# Patient Record
Sex: Female | Born: 1970 | Race: Black or African American | Hispanic: No | Marital: Married | Smoking: Current every day smoker
Health system: Southern US, Community
[De-identification: ages and names within clinical notes are randomized; demographics above are authoritative.]

---

## 2005-12-21 ENCOUNTER — Emergency Department (HOSPITAL_COMMUNITY): Admission: EM | Admit: 2005-12-21 | Discharge: 2005-12-22 | Payer: Self-pay | Admitting: Emergency Medicine

## 2008-08-30 ENCOUNTER — Emergency Department (HOSPITAL_COMMUNITY): Admission: EM | Admit: 2008-08-30 | Discharge: 2008-08-30 | Payer: Self-pay | Admitting: Emergency Medicine

## 2010-01-02 ENCOUNTER — Emergency Department (HOSPITAL_COMMUNITY): Admission: EM | Admit: 2010-01-02 | Discharge: 2010-01-02 | Payer: Self-pay | Admitting: Family Medicine

## 2010-01-03 ENCOUNTER — Emergency Department (HOSPITAL_COMMUNITY): Admission: EM | Admit: 2010-01-03 | Discharge: 2010-01-03 | Payer: Self-pay | Admitting: Family Medicine

## 2012-12-27 ENCOUNTER — Encounter (HOSPITAL_COMMUNITY): Payer: Self-pay | Admitting: Emergency Medicine

## 2012-12-27 ENCOUNTER — Emergency Department (HOSPITAL_COMMUNITY)
Admission: EM | Admit: 2012-12-27 | Discharge: 2012-12-27 | Disposition: A | Discharge status: Home / Self Care | Payer: Self-pay | Attending: Emergency Medicine | Admitting: Emergency Medicine

## 2012-12-27 DIAGNOSIS — J3489 Other specified disorders of nose and nasal sinuses: Secondary | ICD-10-CM | POA: Insufficient documentation

## 2012-12-27 DIAGNOSIS — R599 Enlarged lymph nodes, unspecified: Secondary | ICD-10-CM | POA: Insufficient documentation

## 2012-12-27 DIAGNOSIS — J351 Hypertrophy of tonsils: Secondary | ICD-10-CM | POA: Insufficient documentation

## 2012-12-27 DIAGNOSIS — M25642 Stiffness of left hand, not elsewhere classified: Secondary | ICD-10-CM

## 2012-12-27 DIAGNOSIS — F17200 Nicotine dependence, unspecified, uncomplicated: Secondary | ICD-10-CM | POA: Insufficient documentation

## 2012-12-27 DIAGNOSIS — M25649 Stiffness of unspecified hand, not elsewhere classified: Secondary | ICD-10-CM | POA: Insufficient documentation

## 2012-12-27 DIAGNOSIS — J029 Acute pharyngitis, unspecified: Secondary | ICD-10-CM | POA: Insufficient documentation

## 2012-12-27 DIAGNOSIS — R591 Generalized enlarged lymph nodes: Secondary | ICD-10-CM

## 2012-12-27 NOTE — Progress Notes (Signed)
[facility] provided pt with a [employer] application. Set patient up with an apt for enrollment with the [facility] Card at [facility] on 10/3.

## 2012-12-27 NOTE — ED Notes (Signed)
Pt escorted to discharge window. Pt verbalized understanding discharge instructions. In no acute distress.  

## 2012-12-27 NOTE — ED Notes (Addendum)
Patient reports to the emergency department for swollen lymph node on left side and swelling with decreased mobility to second joint in left index finger. Denies difficulty swallowing/eating, denies SOB.

## 2012-12-27 NOTE — ED Provider Notes (Signed)
CSN: [id number]     Arrival date & time 12/27/12  2841 History   First MD Initiated Contact with Patient 12/27/12 [phone]     Chief Complaint  Patient presents with  . Lymphadenopathy  . Finger Injury   (Consider location/radiation/quality/duration/timing/severity/associated sxs/prior Treatment) HPI  Sherri Stokes is a 42 y.o. female complaining of painful swollen right anterior cervical lymph node associated with rhinorrhea over the course last 2 days. Patient also has a atraumatic left second digit PIP stiffness intermittently associated with a mild swelling over 2 days as well. Patient denies any other joint swelling, fever, pain, cough, pharyngitis, shortness of breath, night sweats, unintended weight loss, easy bruising or bleeding.    No past medical history on file. No past surgical history on file. No family history on file. History  Substance Use Topics  . Smoking status: Current Every Day Smoker -- 0.10 packs/day  . Smokeless tobacco: Not on file  . Alcohol Use: Yes     Comment: socially, weekends   OB History   Grav Para Term Preterm Abortions TAB SAB Ect Mult Living                 Review of Systems 10 systems reviewed and found to be negative, except as noted in the HPI   Allergies  Review of patient's allergies indicates no known allergies.  Home Medications  No current outpatient prescriptions on file. BP 121/70  Pulse 94  Temp(Src) 98.8 F (37.1 C) (Oral)  Resp 16  SpO2 100% Physical Exam  Nursing note and vitals reviewed. Constitutional: She is oriented to person, place, and time. She appears well-developed and well-nourished. No distress.  HENT:  Head: Normocephalic.  Mouth/Throat: Oropharynx is clear and moist.  Posterior pharynx is injected, tonsillar hypertrophy exudate or erythema  Eyes: Conjunctivae and EOM are normal. Pupils are equal, round, and reactive to light.  Neck: Normal range of motion.  1 cm tender mobile anterior cervical lymph node  to the right side. Left side shows scattered shoddy lymphadenopathy nontender.  Cardiovascular: Normal rate, regular rhythm and intact distal pulses.   Pulmonary/Chest: Effort normal and breath sounds normal. No stridor. No respiratory distress. She has no wheezes. She has no rales. She exhibits no tenderness.  Abdominal: Soft. There is no tenderness.  Musculoskeletal: Normal range of motion.  Left second digit PIP shows excellent range of motion, no tenderness to palpation, neurovascularly intact.  Lymphadenopathy:    She has cervical adenopathy.  Neurological: She is alert and oriented to person, place, and time.  Psychiatric: She has a normal mood and affect.    ED Course  Procedures (including critical care time) Labs Review Labs Reviewed - No data to display Imaging Review No results found.  MDM   1. Lymphadenopathy   2. Finger stiffness, left      Filed Vitals:   12/27/12 0942  BP: 121/70  Pulse: 94  Temp: 98.8 F (37.1 C)  TempSrc: Oral  Resp: 16  SpO2: 100%     Sherri Stokes is a 42 y.o. female with isolated mobile tender right-sided anterior cervical lymphadenopathy. Thank this is likely secondary to allergic rhinorrhea with postnasal drip. No systemic symptoms. Patient also reports a left-sided finger stiffness, denies trauma, full range of motion today.  Pt is hemodynamically stable, appropriate for, and amenable to discharge at this time. Pt verbalized understanding and agrees with care plan. All questions answered. Outpatient follow-up and specific return precautions discussed.    Note: Portions of this report  may have been transcribed using voice recognition software. Every effort was made to ensure accuracy; however, inadvertent computerized transcription errors may be present      Wynetta Emery, PA-C 12/27/12 1009

## 2012-12-28 NOTE — ED Provider Notes (Signed)
Medical screening examination/treatment/procedure(s) were performed by non-physician practitioner and as supervising physician I was immediately available for consultation/collaboration.  Chrystian Cupples T Chrisha Vogel, MD 12/28/12 0709 

## 2013-01-07 ENCOUNTER — Ambulatory Visit: Payer: Self-pay

## 2013-04-26 ENCOUNTER — Emergency Department (HOSPITAL_COMMUNITY)
Admission: EM | Admit: 2013-04-26 | Discharge: 2013-04-26 | Disposition: A | Discharge status: Home / Self Care | Payer: Self-pay | Attending: Emergency Medicine | Admitting: Emergency Medicine

## 2013-04-26 ENCOUNTER — Encounter (HOSPITAL_COMMUNITY): Payer: Self-pay | Admitting: Emergency Medicine

## 2013-04-26 DIAGNOSIS — R42 Dizziness and giddiness: Secondary | ICD-10-CM | POA: Insufficient documentation

## 2013-04-26 DIAGNOSIS — R51 Headache: Secondary | ICD-10-CM | POA: Insufficient documentation

## 2013-04-26 DIAGNOSIS — F17200 Nicotine dependence, unspecified, uncomplicated: Secondary | ICD-10-CM | POA: Insufficient documentation

## 2013-04-26 DIAGNOSIS — D649 Anemia, unspecified: Secondary | ICD-10-CM | POA: Insufficient documentation

## 2013-04-26 LAB — CBC WITH DIFFERENTIAL/PLATELET
BASOS PCT: 2 % — AB (ref 0–1)
Basophils Absolute: 0.1 10*3/uL (ref 0.0–0.1)
EOS ABS: 0.4 10*3/uL (ref 0.0–0.7)
EOS PCT: 6 % — AB (ref 0–5)
HEMATOCRIT: 26.9 % — AB (ref 36.0–46.0)
HEMOGLOBIN: 7.4 g/dL — AB (ref 12.0–15.0)
LYMPHS PCT: 32 % (ref 12–46)
Lymphs Abs: 2.1 10*3/uL (ref 0.7–4.0)
MCH: 16.6 pg — ABNORMAL LOW (ref 26.0–34.0)
MCHC: 27.5 g/dL — AB (ref 30.0–36.0)
MCV: 60.3 fL — ABNORMAL LOW (ref 78.0–100.0)
MONOS PCT: 9 % (ref 3–12)
Monocytes Absolute: 0.6 10*3/uL (ref 0.1–1.0)
NEUTROS ABS: 3.5 10*3/uL (ref 1.7–7.7)
Neutrophils Relative %: 51 % (ref 43–77)
Platelets: 439 10*3/uL — ABNORMAL HIGH (ref 150–400)
RBC: 4.46 MIL/uL (ref 3.87–5.11)
RDW: 22.4 % — ABNORMAL HIGH (ref 11.5–15.5)
WBC: 6.7 10*3/uL (ref 4.0–10.5)

## 2013-04-26 LAB — POCT I-STAT, CHEM 8
CHLORIDE: 106 meq/L (ref 96–112)
CREATININE: 0.6 mg/dL (ref 0.50–1.10)
Calcium, Ion: 0.96 mmol/L — ABNORMAL LOW (ref 1.12–1.23)
GLUCOSE: 78 mg/dL (ref 70–99)
HCT: 29 % — ABNORMAL LOW (ref 36.0–46.0)
Hemoglobin: 9.9 g/dL — ABNORMAL LOW (ref 12.0–15.0)
POTASSIUM: 5 meq/L (ref 3.7–5.3)
Sodium: 135 mEq/L — ABNORMAL LOW (ref 137–147)
TCO2: 23 mmol/L (ref 0–100)

## 2013-04-26 LAB — OCCULT BLOOD, POC DEVICE: Fecal Occult Bld: NEGATIVE

## 2013-04-26 MED ORDER — SODIUM CHLORIDE 0.9 % IV BOLUS (SEPSIS)
1000.0000 mL | Freq: Once | INTRAVENOUS | Status: AC
  Administered 2013-04-26: 1000 mL via INTRAVENOUS

## 2013-04-26 NOTE — ED Provider Notes (Signed)
CSN: [id number]     Arrival date & time 04/26/13  1032 History   First MD Initiated Contact with Patient 04/26/13 1118     Chief Complaint  Patient presents with  . Dizziness   (Consider location/radiation/quality/duration/timing/severity/associated sxs/prior Treatment) The history is provided by the patient.  Sherri Stokes is a 43 y.o. female otherwise healthy here with headache, dizziness. Intermittent headaches for months, worse in the last 2 weeks. Its in the L side of her head. No neck pain or fever. She also feels dizzy and felt lightheaded. She often feels a warm sensation and felt like passing out. No actual syncope for years. No chest pain or shortness of breath or history of CAD.    History reviewed. No pertinent past medical history. History reviewed. No pertinent past surgical history. No family history on file. History  Substance Use Topics  . Smoking status: Current Every Day Smoker -- 0.10 packs/day  . Smokeless tobacco: Not on file  . Alcohol Use: Yes     Comment: socially, weekends   OB History   Grav Para Term Preterm Abortions TAB SAB Ect Mult Living                 Review of Systems  Neurological: Positive for dizziness and headaches.  All other systems reviewed and are negative.    Allergies  Review of patient's allergies indicates no known allergies.  Home Medications  No current outpatient prescriptions on file. BP 105/78  Pulse 92  Temp(Src) 98.3 F (36.8 C)  Resp 20  SpO2 100%  LMP 04/07/2013 Physical Exam  Nursing note and vitals reviewed. Constitutional: She is oriented to person, place, and time. She appears well-developed and well-nourished.  HENT:  Head: Normocephalic and atraumatic.  Mouth/Throat: Oropharynx is clear and moist.  Eyes: Conjunctivae and EOM are normal. Pupils are equal, round, and reactive to light.  No nystagmus   Neck: Normal range of motion. Neck supple.  Cardiovascular: Normal rate, regular rhythm and normal heart  sounds.   Pulmonary/Chest: Effort normal and breath sounds normal. No respiratory distress. She has no wheezes. She has no rales.  Abdominal: Soft. Bowel sounds are normal. She exhibits no distension. There is no tenderness. There is no rebound and no guarding.  Musculoskeletal: Normal range of motion. She exhibits no edema and no tenderness.  Neurological: She is alert and oriented to person, place, and time. No cranial nerve deficit. Coordination normal.  Nl strength and sensation throughout   Skin: Skin is warm and dry.  Psychiatric: She has a normal mood and affect. Her behavior is normal. Judgment and thought content normal.    ED Course  Procedures (including critical care time) Labs Review Labs Reviewed  CBC WITH DIFFERENTIAL - Abnormal; Notable for the following:    Hemoglobin 7.4 (*)    HCT 26.9 (*)    MCV 60.3 (*)    MCH 16.6 (*)    MCHC 27.5 (*)    RDW 22.4 (*)    Platelets 439 (*)    Eosinophils Relative 6 (*)    Basophils Relative 2 (*)    All other components within normal limits  POCT I-STAT, CHEM 8 - Abnormal; Notable for the following:    Sodium 135 (*)    BUN <3 (*)    Calcium, Ion 0.96 (*)    Hemoglobin 9.9 (*)    HCT 29.0 (*)    All other components within normal limits  OCCULT BLOOD, POC DEVICE   Imaging Review  No results found.  EKG Interpretation    Date/Time:  Tuesday April 26 2013 10:52:08 EST Ventricular Rate:  100 PR Interval:  136 QRS Duration: 77 QT Interval:  337 QTC Calculation: 435 R Axis:   42 Text Interpretation:  Sinus tachycardia LAE, consider biatrial enlargement Baseline wander in lead(s) V6 No previous ECGs available Confirmed by [place]  MD, Kalid Ghan [phone]) on 04/26/2013 11:25:05 AM            MDM  No diagnosis found. Sherri Stokes is a 43 y.o. female here with headache, near syncope. Headache appears benign, no headache currently. Neuro exam normal so she doesn't need imaging. She has dizziness of unclear etiology. Will get  labs, orthostatics. I doubt cardiac cause or PE. She can get outpatient holter monitor.   2:55 PM Anemic to 7.4. Occ neg. She has been having heavier bleeding. Not orthostatic but BP around 100. Given 1L NS bolus. I think she may have dizziness from anemia. She is healthy so I don't think she needs transfusion. [facility].    Wandra Arthurs, MD 04/26/13 [phone]

## 2013-04-26 NOTE — Discharge Instructions (Signed)
You are slightly anemic. [facility] and get [facility].   If you have a lot of palpitations you may need a holter monitor as well.   Return to ER if you have worse dizziness, passing out, chest pain.

## 2013-04-26 NOTE — Progress Notes (Signed)
[facility] provided pt with a list of primary care resources, [facility] information, and a [facility] Orange Card application.  °

## 2013-04-26 NOTE — ED Notes (Signed)
Pt c/o dizziness x 1 wk; c/o left sided different than normal headache x 2 wks; pt states has history of getting overheated and passing out--concerned this is related

## 2013-05-19 ENCOUNTER — Ambulatory Visit: Payer: Self-pay | Attending: Internal Medicine

## 2013-05-19 ENCOUNTER — Ambulatory Visit: Payer: Self-pay | Admitting: Internal Medicine

## 2013-05-31 ENCOUNTER — Ambulatory Visit: Payer: Self-pay

## 2013-06-09 ENCOUNTER — Ambulatory Visit: Payer: Self-pay

## 2013-06-10 ENCOUNTER — Emergency Department (HOSPITAL_COMMUNITY): Payer: Self-pay

## 2013-06-10 ENCOUNTER — Emergency Department (HOSPITAL_COMMUNITY)
Admission: EM | Admit: 2013-06-10 | Discharge: 2013-06-10 | Disposition: A | Discharge status: Home / Self Care | Payer: Self-pay | Attending: Emergency Medicine | Admitting: Emergency Medicine

## 2013-06-10 DIAGNOSIS — F17200 Nicotine dependence, unspecified, uncomplicated: Secondary | ICD-10-CM | POA: Insufficient documentation

## 2013-06-10 DIAGNOSIS — N920 Excessive and frequent menstruation with regular cycle: Secondary | ICD-10-CM | POA: Insufficient documentation

## 2013-06-10 DIAGNOSIS — E876 Hypokalemia: Secondary | ICD-10-CM | POA: Insufficient documentation

## 2013-06-10 DIAGNOSIS — Z79899 Other long term (current) drug therapy: Secondary | ICD-10-CM | POA: Insufficient documentation

## 2013-06-10 DIAGNOSIS — J209 Acute bronchitis, unspecified: Secondary | ICD-10-CM | POA: Insufficient documentation

## 2013-06-10 DIAGNOSIS — D649 Anemia, unspecified: Secondary | ICD-10-CM | POA: Insufficient documentation

## 2013-06-10 DIAGNOSIS — R197 Diarrhea, unspecified: Secondary | ICD-10-CM | POA: Insufficient documentation

## 2013-06-10 LAB — CBC WITH DIFFERENTIAL/PLATELET
Basophils Absolute: 0.1 10*3/uL (ref 0.0–0.1)
Basophils Relative: 1 % (ref 0–1)
EOS ABS: 0.6 10*3/uL (ref 0.0–0.7)
EOS PCT: 5 % (ref 0–5)
HEMATOCRIT: 28.3 % — AB (ref 36.0–46.0)
HEMOGLOBIN: 7.6 g/dL — AB (ref 12.0–15.0)
LYMPHS ABS: 1.8 10*3/uL (ref 0.7–4.0)
LYMPHS PCT: 14 % (ref 12–46)
MCH: 16.9 pg — AB (ref 26.0–34.0)
MCHC: 26.9 g/dL — ABNORMAL LOW (ref 30.0–36.0)
MCV: 62.7 fL — AB (ref 78.0–100.0)
MONOS PCT: 2 % — AB (ref 3–12)
Monocytes Absolute: 0.3 10*3/uL (ref 0.1–1.0)
Neutro Abs: 9.7 10*3/uL — ABNORMAL HIGH (ref 1.7–7.7)
Neutrophils Relative %: 78 % — ABNORMAL HIGH (ref 43–77)
Platelets: 301 10*3/uL (ref 150–400)
RBC: 4.51 MIL/uL (ref 3.87–5.11)
RDW: 22.5 % — ABNORMAL HIGH (ref 11.5–15.5)
WBC: 12.5 10*3/uL — ABNORMAL HIGH (ref 4.0–10.5)

## 2013-06-10 LAB — I-STAT CHEM 8, ED
CALCIUM ION: 1.16 mmol/L (ref 1.12–1.23)
CHLORIDE: 107 meq/L (ref 96–112)
CREATININE: 0.6 mg/dL (ref 0.50–1.10)
Glucose, Bld: 92 mg/dL (ref 70–99)
HCT: 31 % — ABNORMAL LOW (ref 36.0–46.0)
Hemoglobin: 10.5 g/dL — ABNORMAL LOW (ref 12.0–15.0)
Potassium: 3.4 mEq/L — ABNORMAL LOW (ref 3.7–5.3)
Sodium: 142 mEq/L (ref 137–147)
TCO2: 21 mmol/L (ref 0–100)

## 2013-06-10 LAB — POC OCCULT BLOOD, ED: Fecal Occult Bld: NEGATIVE

## 2013-06-10 MED ORDER — AZITHROMYCIN 250 MG PO TABS: ORAL_TABLET | ORAL | Status: AC

## 2013-06-10 MED ORDER — POTASSIUM CHLORIDE CRYS ER 20 MEQ PO TBCR: 20.0000 meq | EXTENDED_RELEASE_TABLET | Freq: Two times a day (BID) | ORAL | Status: AC

## 2013-06-10 MED ORDER — AEROCHAMBER Z-STAT PLUS/MEDIUM MISC
1.0000 | Freq: Once | Status: AC
  Administered 2013-06-10: 1

## 2013-06-10 MED ORDER — IPRATROPIUM BROMIDE 0.02 % IN SOLN
0.5000 mg | Freq: Once | RESPIRATORY_TRACT | Status: AC
  Administered 2013-06-10: 0.5 mg via RESPIRATORY_TRACT
  Filled 2013-06-10: qty 2.5

## 2013-06-10 MED ORDER — ALBUTEROL SULFATE HFA 108 (90 BASE) MCG/ACT IN AERS
2.0000 | INHALATION_SPRAY | RESPIRATORY_TRACT | Status: DC | PRN
  Administered 2013-06-10: 2 via RESPIRATORY_TRACT
  Filled 2013-06-10 (×2): qty 6.7

## 2013-06-10 MED ORDER — PREDNISONE 20 MG PO TABS: ORAL_TABLET | ORAL | Status: AC

## 2013-06-10 MED ORDER — ALBUTEROL SULFATE HFA 108 (90 BASE) MCG/ACT IN AERS: 2.0000 | INHALATION_SPRAY | RESPIRATORY_TRACT | Status: AC | PRN

## 2013-06-10 MED ORDER — ALBUTEROL SULFATE (2.5 MG/3ML) 0.083% IN NEBU
5.0000 mg | INHALATION_SOLUTION | Freq: Once | RESPIRATORY_TRACT | Status: AC
  Administered 2013-06-10: 5 mg via RESPIRATORY_TRACT
  Filled 2013-06-10: qty 6

## 2013-06-10 MED ORDER — POTASSIUM CHLORIDE CRYS ER 20 MEQ PO TBCR
40.0000 meq | EXTENDED_RELEASE_TABLET | Freq: Once | ORAL | Status: AC
  Administered 2013-06-10: 40 meq via ORAL
  Filled 2013-06-10: qty 2

## 2013-06-10 NOTE — ED Notes (Signed)
IV Removed. Clean, Dry intact. Catheter complete upon removal.

## 2013-06-10 NOTE — ED Provider Notes (Addendum)
CSN: [id number]     Arrival date & time 06/10/13  3818 History   First MD Initiated Contact with Patient 06/10/13 [phone]     Chief Complaint  Patient presents with  . Shortness of Breath     (Consider location/radiation/quality/duration/timing/severity/associated sxs/prior Treatment) HPI Patient reports 4 days ago she started having rhinorrhea and nasal stuffiness. She states her nasal drainage was clear. She states she took Benadryl and it seemed to improve. She reports yesterday evening about 7:30 she started feeling short of breath. She put olive oil mixed with black seed oil on her chest which seemed to help a little. However at 4 AM she woke up with shortness of breath and wheezing. She denies cough to me although she did describe coffee EMS. She denies known fever but states she's been having hot and cold spells. She denies sore throat, nausea or vomiting. She states she did have diarrhea about 4 times 3 days ago and 2 days ago but it's stopped yesterday. She states she feels like her chest is tight and is hard to take a big deep breath. Patient states she's never had this before. She's never had asthma in the past. She's never had to use an inhaler or had a nebulizer treatment in the past. She denies any family history of asthma. Patient does smoke but has never smoked heavy such as one pack a day. She states she's currently smoking 4 cigarettes a day.  Patient was brought to the emergency department by EMS. They gave her IV Solu-Medrol and a nebulizer treatment which she states has helped a lot. She still feels like she has some shortness of breath although much improved.  PCP will be Dr Shelia Media  No past medical history on file. No past surgical history on file. No family history on file. History  Substance Use Topics  . Smoking status: Current Every Day Smoker -- 0.10 packs/day  . Smokeless tobacco: Not on file  . Alcohol Use: Yes     Comment: socially, weekends  employed at school  c[employer] Has never smoked more than 1 ppweek, smoking 4 cigs a day   OB History   Grav Para Term Preterm Abortions TAB SAB Ect Mult Living                 Review of Systems  All other systems reviewed and are negative.      Allergies  Review of patient's allergies indicates no known allergies.  Home Medications   Current Outpatient Rx  Name  Route  Sig  Dispense  Refill  . Cyanocobalamin (B-12 PO)   Oral   Take 1 tablet by mouth daily.         . diphenhydrAMINE (BENADRYL) 25 MG tablet   Oral   Take 25 mg by mouth every 6 (six) hours as needed for allergies.         . folic acid (FOLVITE) 1 MG tablet   Oral   Take 1 mg by mouth daily.         Marland Kitchen ibuprofen (ADVIL,MOTRIN) 200 MG tablet   Oral   Take 200 mg by mouth every 6 (six) hours as needed.          BP 122/73  Pulse 110  Temp(Src) 98.2 F (36.8 C) (Oral)  Resp 18  SpO2 100%  Vital signs normal except tachycardia  Physical Exam  Nursing note and vitals reviewed. Constitutional: She is oriented to person, place, and time. She appears well-developed and well-nourished.  Non-toxic appearance. She does not appear ill. No distress.  HENT:  Head: Normocephalic and atraumatic.  Right Ear: External ear normal.  Left Ear: External ear normal.  Nose: Nose normal. No mucosal edema or rhinorrhea.  Mouth/Throat: Oropharynx is clear and moist and mucous membranes are normal. No dental abscesses or uvula swelling.  tender maxillary sinuses to palpation  Eyes: Conjunctivae and EOM are normal. Pupils are equal, round, and reactive to light.  Neck: Normal range of motion and full passive range of motion without pain. Neck supple.  Cardiovascular: Normal rate, regular rhythm and normal heart sounds.  Exam reveals no gallop and no friction rub.   No murmur heard. Pulmonary/Chest: Effort normal. No respiratory distress. She has decreased breath sounds. She has no wheezes. She has no rhonchi. She has no rales. She  exhibits no tenderness and no crepitus.  Patient has diffuse diminished breath sounds without wheezing at this time  Abdominal: Soft. Normal appearance and bowel sounds are normal. She exhibits no distension. There is no tenderness. There is no rebound and no guarding.  Musculoskeletal: Normal range of motion. She exhibits no edema and no tenderness.  Moves all extremities well.   Neurological: She is alert and oriented to person, place, and time. She has normal strength. No cranial nerve deficit.  Skin: Skin is warm, dry and intact. No rash noted. No erythema. No pallor.  Psychiatric: She has a normal mood and affect. Her speech is normal and behavior is normal. Her mood appears not anxious.    ED Course  Procedures (including critical care time)  Medications  albuterol (PROVENTIL HFA;VENTOLIN HFA) 108 (90 BASE) MCG/ACT inhaler 2 puff (2 puffs Inhalation Given 06/10/13 0919)  albuterol (PROVENTIL) (2.5 MG/3ML) 0.083% nebulizer solution 5 mg (5 mg Nebulization Given 06/10/13 0757)  ipratropium (ATROVENT) nebulizer solution 0.5 mg (0.5 mg Nebulization Given 06/10/13 0757)  aerochamber Z-Stat Plus/medium 1 each (1 each Other Given 06/10/13 0923)  potassium chloride SA (K-DUR,KLOR-CON) CR tablet 40 mEq (40 mEq Oral Given 06/10/13 0918)    08 55 recheck patient has much improved air movement. There is no wheezing. She states she feels better. He feels ready to be discharged.  She is aware of her scoliosis.  Patient noted to have moderate anemia. She states she has very heavy menses. She was also told in January she was anemic. She states she is not taking iron pills. She was advised to take iron pills over-the-counter. She states her mother wants her to see Dr. Garwin Brothers about her menstrual problems. Patient states she still wants to get pregnant.  Labs Review Results for orders placed during the hospital encounter of 06/10/13  CBC WITH DIFFERENTIAL      Result Value Ref Range   WBC 12.5 (*) 4.0 -  10.5 K/uL   RBC 4.51  3.87 - 5.11 MIL/uL   Hemoglobin 7.6 (*) 12.0 - 15.0 g/dL   HCT 28.3 (*) 36.0 - 46.0 %   MCV 62.7 (*) 78.0 - 100.0 fL   MCH 16.9 (*) 26.0 - 34.0 pg   MCHC 26.9 (*) 30.0 - 36.0 g/dL   RDW 22.5 (*) 11.5 - 15.5 %   Platelets 301  150 - 400 K/uL   Neutrophils Relative % 78 (*) 43 - 77 %   Neutro Abs 9.7 (*) 1.7 - 7.7 K/uL   Lymphocytes Relative 14  12 - 46 %   Lymphs Abs 1.8  0.7 - 4.0 K/uL   Monocytes Relative 2 (*) 3 - 12 %  Monocytes Absolute 0.3  0.1 - 1.0 K/uL   Eosinophils Relative 5  0 - 5 %   Eosinophils Absolute 0.6  0.0 - 0.7 K/uL   Basophils Relative 1  0 - 1 %   Basophils Absolute 0.1  0.0 - 0.1 K/uL   RBC Morphology ELLIPTOCYTES    I-STAT CHEM 8, ED      Result Value Ref Range   Sodium 142  137 - 147 mEq/L   Potassium 3.4 (*) 3.7 - 5.3 mEq/L   Chloride 107  96 - 112 mEq/L   BUN <3 (*) 6 - 23 mg/dL   Creatinine, Ser 0.60  0.50 - 1.10 mg/dL   Glucose, Bld 92  70 - 99 mg/dL   Calcium, Ion 1.16  1.12 - 1.23 mmol/L   TCO2 21  0 - 100 mmol/L   Hemoglobin 10.5 (*) 12.0 - 15.0 g/dL   HCT 31.0 (*) 36.0 - 46.0 %   Laboratory interpretation all normal except mild hypokalemia, stable anemia    Imaging Review Dg Chest 2 View  06/10/2013   CLINICAL DATA:  Short of breath, wheezing  EXAM: CHEST  2 VIEW  COMPARISON:  None.  FINDINGS: The lungs are clear and negative for focal airspace consolidation, pulmonary edema or suspicious pulmonary nodule. No pleural effusion or pneumothorax. Cardiac and mediastinal contours are within normal limits. No acute fracture or lytic or blastic osseous lesions. Abnormal appearance of the lower thoracic spine. 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 The visualized upper abdominal bowel gas pattern is unremarkable.  IMPRESSION: 1. No active cardiopulmonary disease. 2. 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 This results in a focal levoconvex scoliotic curvature.   Electronically Signed   By: Jacqulynn Cadet M.D.   On: 06/10/2013 08:04     EKG Interpretation None      MDM  patient presents with new onset of wheezing, she has bronchitis with bronchospasm which responded well to nebulizers. She has a mild hypokalemia consistent with her prior history of diarrhea. Patient has no pneumonia or other symptoms concerning for pneumonia although she will be treated for her bronchitis with antibiotics. Patient is stable for discharge.    Final diagnoses:  Bronchitis with bronchospasm  Diarrhea  Hypokalemia  Anemia  Menorrhagia    New Prescriptions   ALBUTEROL (PROVENTIL HFA;VENTOLIN HFA) 108 (90 BASE) MCG/ACT INHALER    Inhale 2 puffs into the lungs every 4 (four) hours as needed for wheezing or shortness of breath.   AZITHROMYCIN (ZITHROMAX) 250 MG TABLET    Take 2 po the first day then once a day for the next 4 days.   POTASSIUM CHLORIDE SA (K-DUR,KLOR-CON) 20 MEQ TABLET    Take 1 tablet (20 mEq total) by mouth 2 (two) times daily.   PREDNISONE (DELTASONE) 20 MG TABLET    Take 3 po QD x 2d starting tomorrow, then 2 po QD x 3d then 1 po QD x 3d  OTC ferrous sequels mucinex DM   Plan discharge   Rolland Porter, MD, Alanson Aly, MD 06/10/13 0737  Janice Norrie, MD 06/10/13 [phone]

## 2013-06-10 NOTE — ED Notes (Signed)
Albuterol Treatment finished. Family at the bedside. Patient states, "I feel much better. I can actually breath. I am still congested in my nose, but that is all."

## 2013-06-10 NOTE — Discharge Instructions (Signed)
Drink plenty of fluids. Avoid milk until the diarrhea is gone, if it returns you can take imodium OTC. Take mucinex DM generic OTC for cough. Use the inhaler for wheezing or shortness of breath. Take the antibiotic and prednisone until gone. Recheck if you get worse again. Take ferrous sequels OTC 2 times a day to help your anemia. You will need to take it as long as you have heavy periods.

## 2013-06-10 NOTE — Discharge Planning (Signed)
C1EX Felicia E, Community Liaison  Spoke to patient about primary care resources. Patient states she is establishing care and looking to obtain the orange card from resources she received at [facility]. Patient wasn't really receptive to taking my information. Patient was given another resource guide and my contact information for any future questions or concerns.

## 2013-06-10 NOTE — ED Notes (Signed)
Pt from home. Has had SOB, cough, and diarrhea x 5 days. Experiencing flu like symptoms. Called EMS. Pt found to have expiratory wheezing. [facility] administered 5mg  albeterul nebulizer and 125mg  solumedrol via 22g piv in left a/c. Pt no longer wheezing and denies pain at this time.

## 2013-06-10 NOTE — ED Notes (Signed)
Informed EDP that pt's hgb resulted at 7.6, pt reports that she has had blood in her stool over the past week. States that she thinks that is anemic.

## 2014-01-25 ENCOUNTER — Encounter (HOSPITAL_COMMUNITY): Payer: Self-pay | Admitting: Emergency Medicine

## 2014-01-25 ENCOUNTER — Emergency Department (HOSPITAL_COMMUNITY)
Admission: EM | Admit: 2014-01-25 | Discharge: 2014-01-25 | Disposition: A | Discharge status: Home / Self Care | Payer: Self-pay | Attending: Emergency Medicine | Admitting: Emergency Medicine

## 2014-01-25 ENCOUNTER — Emergency Department (HOSPITAL_COMMUNITY): Payer: Self-pay

## 2014-01-25 DIAGNOSIS — Y9389 Activity, other specified: Secondary | ICD-10-CM | POA: Insufficient documentation

## 2014-01-25 DIAGNOSIS — Z7952 Long term (current) use of systemic steroids: Secondary | ICD-10-CM | POA: Insufficient documentation

## 2014-01-25 DIAGNOSIS — S3992XA Unspecified injury of lower back, initial encounter: Secondary | ICD-10-CM | POA: Insufficient documentation

## 2014-01-25 DIAGNOSIS — W19XXXA Unspecified fall, initial encounter: Secondary | ICD-10-CM

## 2014-01-25 DIAGNOSIS — Z720 Tobacco use: Secondary | ICD-10-CM | POA: Insufficient documentation

## 2014-01-25 DIAGNOSIS — M7918 Myalgia, other site: Secondary | ICD-10-CM

## 2014-01-25 DIAGNOSIS — Z79899 Other long term (current) drug therapy: Secondary | ICD-10-CM | POA: Insufficient documentation

## 2014-01-25 DIAGNOSIS — W01198A Fall on same level from slipping, tripping and stumbling with subsequent striking against other object, initial encounter: Secondary | ICD-10-CM | POA: Insufficient documentation

## 2014-01-25 DIAGNOSIS — Y92018 Other place in single-family (private) house as the place of occurrence of the external cause: Secondary | ICD-10-CM | POA: Insufficient documentation

## 2014-01-25 MED ORDER — TRAMADOL HCL 50 MG PO TABS: 50.0000 mg | ORAL_TABLET | Freq: Four times a day (QID) | ORAL | Status: AC | PRN

## 2014-01-25 NOTE — ED Notes (Signed)
She states she fell while at a friends home Sat., landed on buttocks.  She c/o coccyx and right buttock area pain.  She is in no distress and denies any paresthesias of any extremity.

## 2014-01-25 NOTE — ED Provider Notes (Signed)
CSN: [id number]     Arrival date & time 01/25/14  1423 History  This chart was scribed for non-physician practitioner, Noland Fordyce, PA-C working with Charlesetta Shanks, MD by Frederich Balding, ED scribe. This patient was seen in room WTR7/WTR7 and the patient's care was started at 2:52 PM.   Chief Complaint  Patient presents with  . Fall   The history is provided by the patient. No language interpreter was used.   HPI Comments: Sherri Stokes is a 43 y.o. female who presents to the Emergency Department complaining of a fall that occurred 4 days ago. States she landed on her buttocks on concrete and has sudden onset coccyx pain. Rates pain 9/10 and describes it as sharp. Reports associated mild numbness and tingling in her right leg down to her foot. Pressure on the area worsens the pain. She has taken tylenol with no relief. Denies upper back pain.   History reviewed. No pertinent past medical history. No past surgical history on file. No family history on file. History  Substance Use Topics  . Smoking status: Current Every Day Smoker -- 0.10 packs/day  . Smokeless tobacco: Not on file  . Alcohol Use: Yes     Comment: socially, weekends   OB History   Grav Para Term Preterm Abortions TAB SAB Ect Mult Living                 Review of Systems  Musculoskeletal:       Coccyx pain  Neurological: Positive for numbness.  All other systems reviewed and are negative.  Allergies  Review of patient's allergies indicates no known allergies.  Home Medications   Prior to Admission medications   Medication Sig Start Date End Date Taking? Authorizing Provider  albuterol (PROVENTIL HFA;VENTOLIN HFA) 108 (90 BASE) MCG/ACT inhaler Inhale 2 puffs into the lungs every 4 (four) hours as needed for wheezing or shortness of breath. 06/10/13   Janice Norrie, MD  azithromycin (ZITHROMAX) 250 MG tablet Take 2 po the first day then once a day for the next 4 days. 06/10/13   Janice Norrie, MD  Cyanocobalamin (B-12  PO) Take 1 tablet by mouth daily.    Historical Provider, MD  diphenhydrAMINE (BENADRYL) 25 MG tablet Take 25 mg by mouth every 6 (six) hours as needed for allergies.    Historical Provider, MD  folic acid (FOLVITE) 1 MG tablet Take 1 mg by mouth daily.    Historical Provider, MD  ibuprofen (ADVIL,MOTRIN) 200 MG tablet Take 200 mg by mouth every 6 (six) hours as needed.    Historical Provider, MD  potassium chloride SA (K-DUR,KLOR-CON) 20 MEQ tablet Take 1 tablet (20 mEq total) by mouth 2 (two) times daily. 06/10/13   Janice Norrie, MD  predniSONE (DELTASONE) 20 MG tablet Take 3 po QD x 2d starting tomorrow, then 2 po QD x 3d then 1 po QD x 3d 06/10/13   Janice Norrie, MD  traMADol (ULTRAM) 50 MG tablet Take 1 tablet (50 mg total) by mouth every 6 (six) hours as needed. 01/25/14   Noland Fordyce, PA-C   BP 118/66  Pulse 108  Temp(Src) 99.2 F (37.3 C) (Oral)  Resp 20  SpO2 95%  LMP 01/10/2014  Physical Exam  Nursing note and vitals reviewed. Constitutional: She is oriented to person, place, and time. She appears well-developed and well-nourished.  HENT:  Head: Normocephalic and atraumatic.  Eyes: EOM are normal.  Neck: Normal range of motion.  Cardiovascular: Normal  rate.   Pulses:      Dorsalis pedis pulses are 2+ on the right side.       Posterior tibial pulses are 2+ on the right side.  Pedal pulses intact.  Pulmonary/Chest: Effort normal.  Musculoskeletal: Normal range of motion.  Tenderness to L5-S1 region and right buttock. Full ROM of right hip.   Neurological: She is alert and oriented to person, place, and time.  Normal gait.  Skin: Skin is warm and dry. No erythema.  No ecchymosis or erythema.  Psychiatric: She has a normal mood and affect. Her behavior is normal.    ED Course  Procedures (including critical care time)  DIAGNOSTIC STUDIES: Oxygen Saturation is 95% on RA, adequate by my interpretation.    COORDINATION OF CARE: 2:53 PM-Discussed treatment plan which  includes xray, anti-inflammatory and pain medication with pt at bedside and pt agreed to plan.   Labs Review Labs Reviewed - No data to display  Imaging Review Dg Sacrum/coccyx  01/25/2014   CLINICAL DATA:  Patient fell backward off a step 2 feet in the air on 01/21/2014. Buttock pain. Initial encounter.  EXAM: SACRUM AND COCCYX - 2+ VIEW  COMPARISON:  None.  FINDINGS: No sacral or coccygeal fracture is identified. No pelvic diastasis is seen. No lytic or blastic osseous lesion. Small calcifications in the pelvis likely represent phleboliths.  IMPRESSION: Negative.   Electronically Signed   By: Logan Bores   On: 01/25/2014 15:30     EKG Interpretation None      MDM   Final diagnoses:  Buttock pain  Fall    Pt c/o pain in right buttock radiating down right leg after fall 5 days ago. No other injuries. Pt is tender over L5-S1 and right buttock. FROM right hip.  Plain films: negative for fracture. Will discharge home, Rx: tramadol. Advised to f/u with PCP in 3-4 days if not improving. Home care instructions provided. Pt verbalized understanding and agreement with tx plan.   I personally performed the services described in this documentation, which was scribed in my presence. The recorded information has been reviewed and is accurate.  Noland Fordyce, PA-C 01/25/14 1546

## 2014-01-30 NOTE — ED Provider Notes (Signed)
Medical screening examination/treatment/procedure(s) were performed by non-physician practitioner and as supervising physician I was immediately available for consultation/collaboration.   EKG Interpretation None       Charlesetta Shanks, MD 01/30/14 [phone]

## 2015-05-30 IMAGING — CR DG SACRUM/COCCYX 2+V
3 series · 3 of 3 positions shown · non-contrast
Comparison: None.

CLINICAL DATA: Patient fell backward off a step 2 feet in the air
on 01/21/2014. Buttock pain. Initial encounter.

EXAM:
SACRUM AND COCCYX - 2+ VIEW

[t sacrum ap]
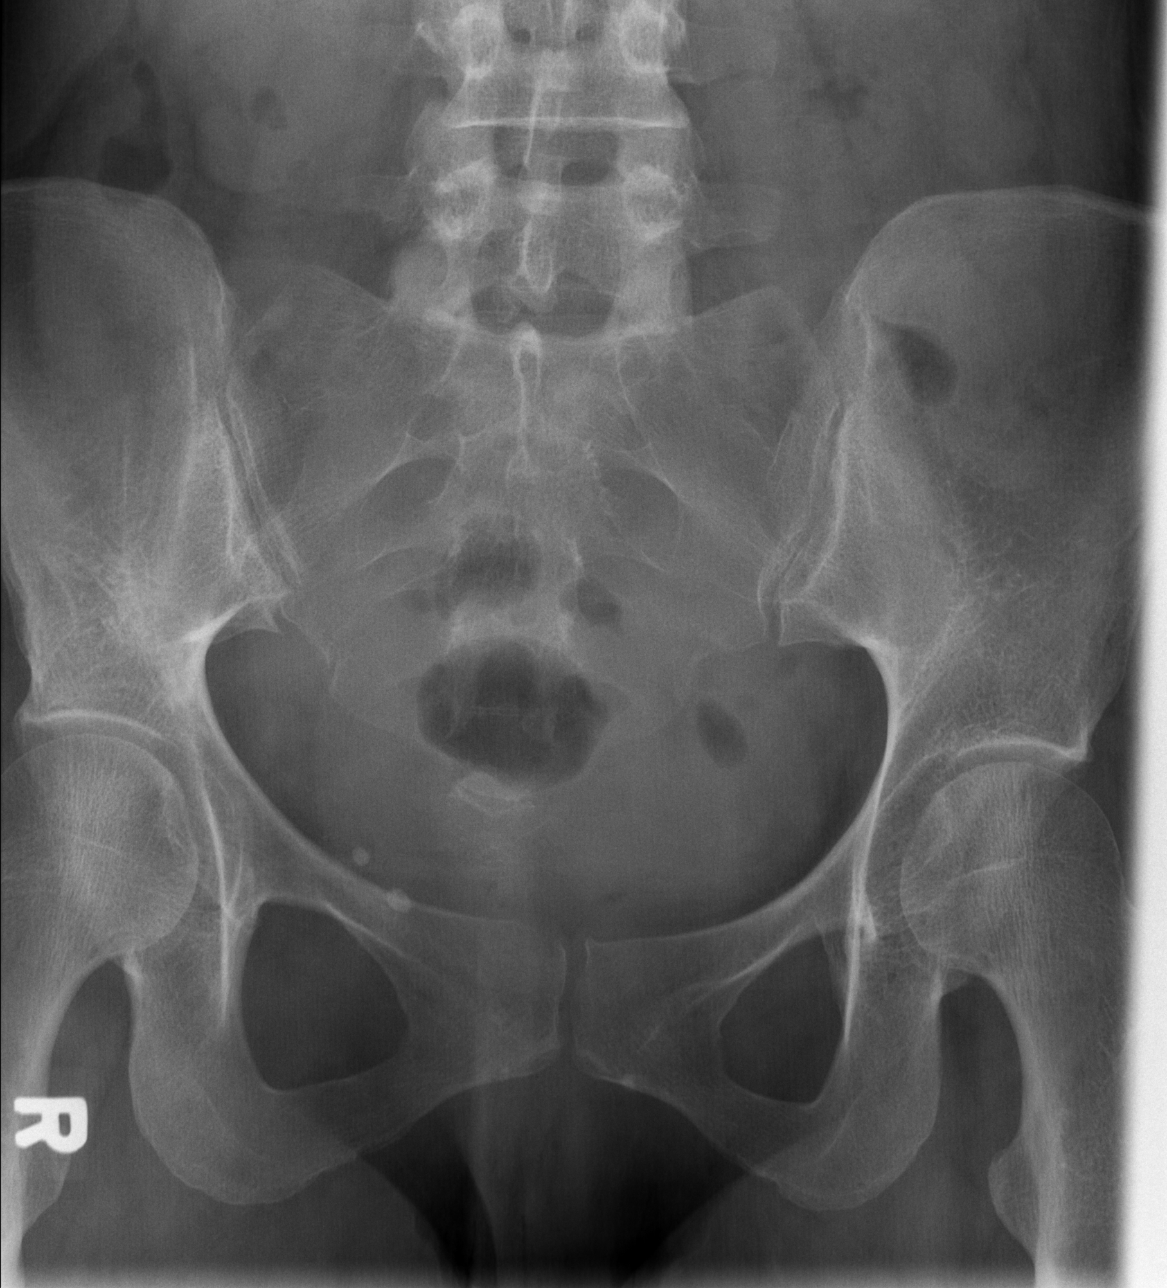

[t coccyx ap]
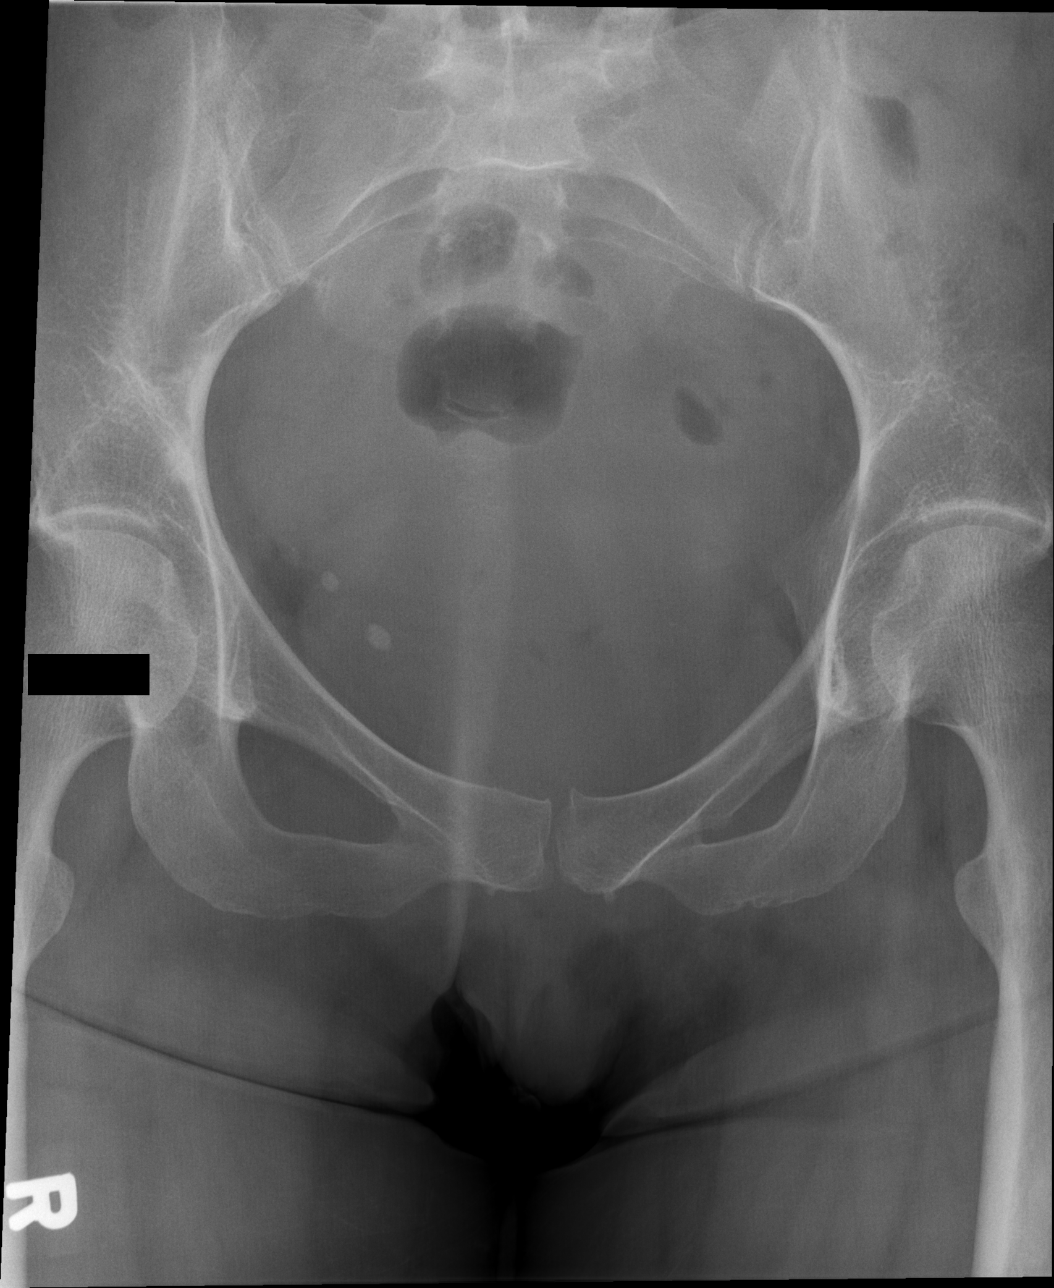

[t sacrum coccyx lat]
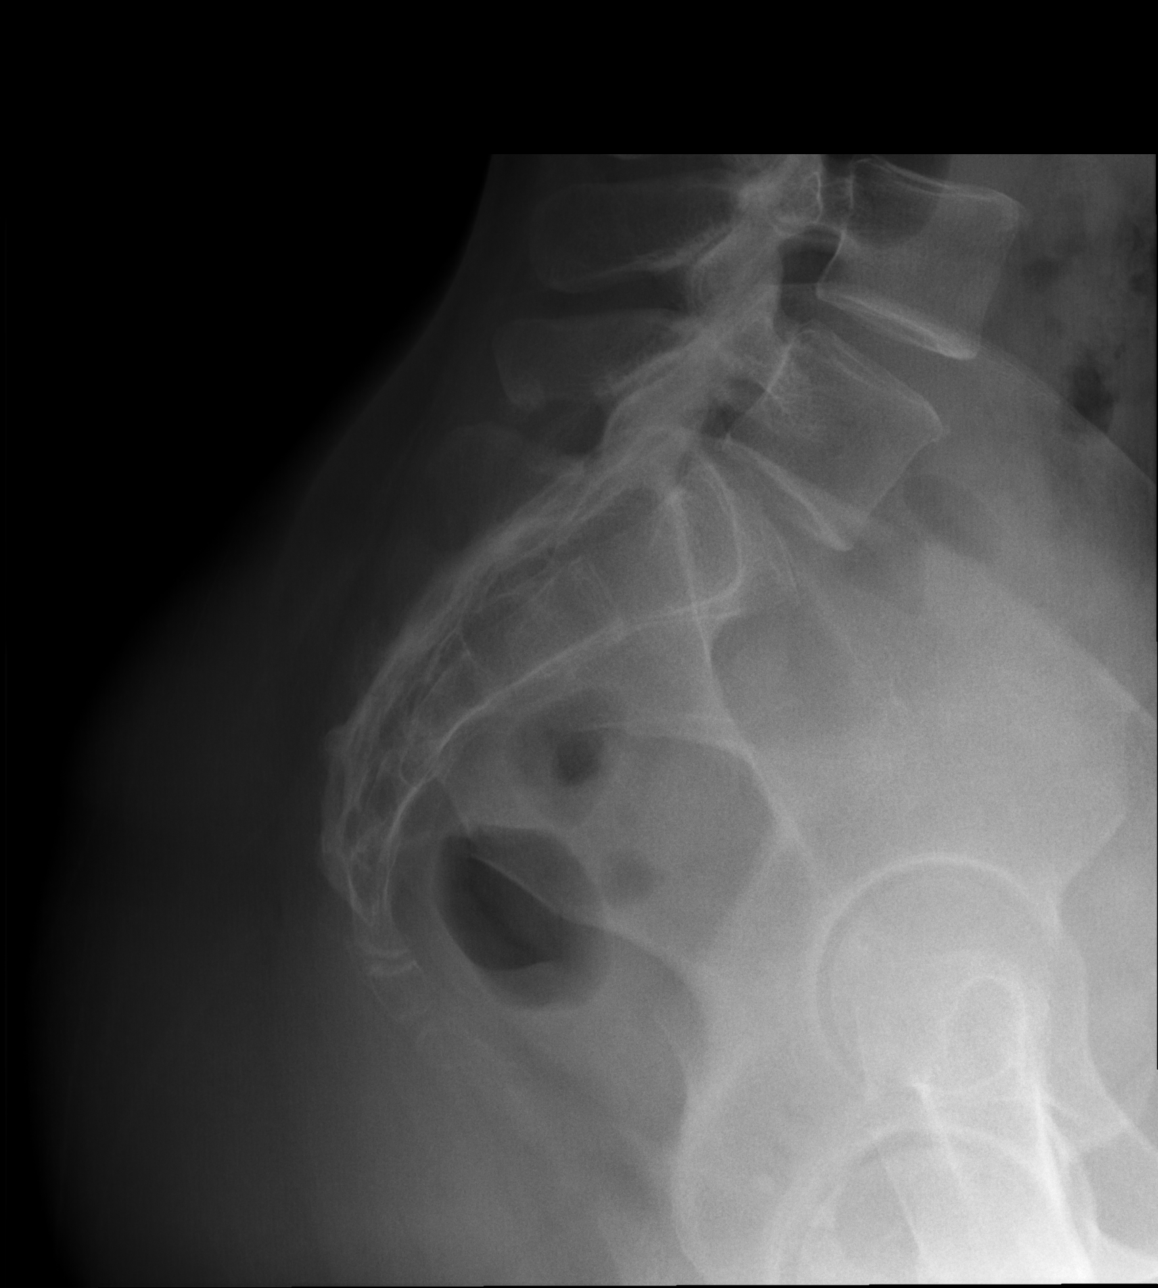

[3 of 3 positions shown; findings below may reference images not displayed]

FINDINGS: No sacral or coccygeal fracture is identified. No pelvic diastasis
is seen. No lytic or blastic osseous lesion. Small calcifications in
the pelvis likely represent phleboliths.
IMPRESSION: Negative.

## 2016-03-26 ENCOUNTER — Other Ambulatory Visit: Payer: Self-pay | Admitting: General Surgery

## 2016-03-26 DIAGNOSIS — Z170 Estrogen receptor positive status [ER+]: Principal | ICD-10-CM

## 2016-03-26 DIAGNOSIS — C50411 Malignant neoplasm of upper-outer quadrant of right female breast: Secondary | ICD-10-CM
